# Patient Record
Sex: Male | Born: 1990 | Race: Black or African American | Hispanic: No
Health system: Southern US, Community
[De-identification: ages and names within clinical notes are randomized; demographics above are authoritative.]

---

## 2020-06-19 ENCOUNTER — Other Ambulatory Visit: Payer: Self-pay

## 2020-06-19 ENCOUNTER — Emergency Department (HOSPITAL_COMMUNITY): Payer: Self-pay

## 2020-06-19 ENCOUNTER — Emergency Department (HOSPITAL_COMMUNITY)
Admission: EM | Admit: 2020-06-19 | Discharge: 2020-06-19 | Disposition: A | Discharge status: Home / Self Care | Payer: Self-pay | Attending: Emergency Medicine | Admitting: Emergency Medicine

## 2020-06-19 ENCOUNTER — Ambulatory Visit (HOSPITAL_COMMUNITY)
Admission: EM | Admit: 2020-06-19 | Discharge: 2020-06-19 | Disposition: A | Discharge status: Home / Self Care | Payer: Self-pay

## 2020-06-19 DIAGNOSIS — Y9289 Other specified places as the place of occurrence of the external cause: Secondary | ICD-10-CM | POA: Insufficient documentation

## 2020-06-19 DIAGNOSIS — S0280XA Fracture of other specified skull and facial bones, unspecified side, initial encounter for closed fracture: Secondary | ICD-10-CM | POA: Insufficient documentation

## 2020-06-19 DIAGNOSIS — Y999 Unspecified external cause status: Secondary | ICD-10-CM | POA: Insufficient documentation

## 2020-06-19 DIAGNOSIS — S0285XA Fracture of orbit, unspecified, initial encounter for closed fracture: Secondary | ICD-10-CM

## 2020-06-19 DIAGNOSIS — W228XXA Striking against or struck by other objects, initial encounter: Secondary | ICD-10-CM | POA: Insufficient documentation

## 2020-06-19 DIAGNOSIS — Y9389 Activity, other specified: Secondary | ICD-10-CM | POA: Insufficient documentation

## 2020-06-19 LAB — BASIC METABOLIC PANEL
Anion gap: 12 (ref 5–15)
BUN: 9 mg/dL (ref 6–20)
CO2: 26 mmol/L (ref 22–32)
Calcium: 9.6 mg/dL (ref 8.9–10.3)
Chloride: 101 mmol/L (ref 98–111)
Creatinine, Ser: 1.08 mg/dL (ref 0.61–1.24)
GFR calc Af Amer: >60 mL/min (ref >60)
GFR calc non Af Amer: >60 mL/min (ref >60)
Glucose, Bld: 100 mg/dL — ABNORMAL HIGH (ref 70–99)
Potassium: 3.9 mmol/L (ref 3.5–5.1)
Sodium: 139 mmol/L (ref 135–145)

## 2020-06-19 LAB — CBC
HCT: 43 % (ref 39.0–52.0)
Hemoglobin: 13.9 g/dL (ref 13.0–17.0)
MCH: 28.4 pg (ref 26.0–34.0)
MCHC: 32.3 g/dL (ref 30.0–36.0)
MCV: 87.9 fL (ref 80.0–100.0)
Platelets: 276 10*3/uL (ref 150–400)
RBC: 4.89 MIL/uL (ref 4.22–5.81)
RDW: 12.6 % (ref 11.5–15.5)
WBC: 14.2 10*3/uL — ABNORMAL HIGH (ref 4.0–10.5)
nRBC: 0 % (ref 0.0–0.2)

## 2020-06-19 MED ORDER — TETRACAINE HCL 0.5 % OP SOLN
1.0000 [drp] | Freq: Once | OPHTHALMIC | Status: AC
  Administered 2020-06-19: 2 [drp] via OPHTHALMIC
  Filled 2020-06-19: qty 4

## 2020-06-19 MED ORDER — OXYCODONE HCL 5 MG PO TABS
5.0000 mg | ORAL_TABLET | Freq: Once | ORAL | Status: AC
  Administered 2020-06-19: 5 mg via ORAL
  Filled 2020-06-19: qty 1

## 2020-06-19 MED ORDER — OXYCODONE HCL 5 MG PO TABS: 5.0000 mg | ORAL_TABLET | ORAL | 0 refills | Status: AC | PRN

## 2020-06-19 MED ORDER — FLUORESCEIN SODIUM 1 MG OP STRP
1.0000 | ORAL_STRIP | Freq: Once | OPHTHALMIC | Status: AC
  Administered 2020-06-19: 1 via OPHTHALMIC
  Filled 2020-06-19: qty 1
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
## 2020-06-19 NOTE — Discharge Instructions (Signed)
You were evaluated in the Emergency Department and after careful evaluation, we did not find any emergent condition requiring admission or further testing in the hospital.  Your testing today showed broken bones of the orbital wall.  As discussed, please avoid any nose blowing and follow-up with ophthalmology and ENT.  Please call the numbers provided tomorrow to set up formal appointment times.  We recommend Tylenol and Motrin at home during the day for discomfort, you can use the oxycodone for more significant pain.  Please return to the Emergency Department if you experience any worsening of your condition.  Thank you for allowing [place] to be a part of your care.
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
## 2020-06-19 NOTE — ED Notes (Signed)
Patient is being discharged from the Urgent Care Center and sent to the Emergency Department via wheelchair by staff. Per [place], [place], patient is stable but in need of higher level of care due to nature of eye injury/possible globe injury. Patient is aware and verbalizes understanding of plan of care. There were no vitals filed for this visit.

## 2020-06-19 NOTE — ED Provider Notes (Signed)
Patient saw briefly in triage, has obvious signs of left eye globe rupture.  Recommended he report to the emergency room now, states that a family member will drive him there.   Jorge Anthony, [place] 06/19/20 1142
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
